# Patient Record
Sex: Male | Born: 1995 | Race: White | Hispanic: Yes | Marital: Single | Smoking: Never smoker
Health system: Southern US, Community
[De-identification: ages and names within clinical notes are randomized; demographics above are authoritative.]

## PROBLEM LIST (undated history)

## (undated) DIAGNOSIS — E7800 Pure hypercholesterolemia, unspecified: Secondary | ICD-10-CM

---
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
## 2020-02-22 ENCOUNTER — Other Ambulatory Visit: Payer: Self-pay

## 2020-02-22 ENCOUNTER — Encounter (HOSPITAL_COMMUNITY): Payer: Self-pay

## 2020-02-22 ENCOUNTER — Ambulatory Visit (HOSPITAL_COMMUNITY)
Admission: EM | Admit: 2020-02-22 | Discharge: 2020-02-22 | Disposition: A | Discharge status: Home / Self Care | Payer: Self-pay | Attending: Family Medicine | Admitting: Family Medicine

## 2020-02-22 DIAGNOSIS — L739 Follicular disorder, unspecified: Secondary | ICD-10-CM

## 2020-02-22 MED ORDER — DOXYCYCLINE HYCLATE 100 MG PO CAPS: 100.0000 mg | ORAL_CAPSULE | Freq: Two times a day (BID) | ORAL | 0 refills | Status: AC

## 2020-02-22 NOTE — ED Triage Notes (Signed)
Pt states rash and bumps on nape of neck x 3 weeks. Some have a slightly yellow center. Pt ao x4 and ambulatory.

## 2020-02-22 NOTE — Discharge Instructions (Signed)
Take the antibiotic as prescribed.  Keep the area clean with and wash daily with antibacterial soap. Follow up as needed for continued or worsening symptoms

## 2020-02-22 NOTE — ED Provider Notes (Signed)
[facility]    CSN: [id number] Arrival date & time: 02/22/20  1004      History   Chief Complaint Chief Complaint  Patient presents with  . Rash    Raised rash/bumps on nape of neck x3 weeks    HPI Daryl Kerr is a 24 y.o. male.   Patient is a 24 year old male presents today with rash.  Rash located to the hairline to posterior scalp area.  Red, raised bumps that are mildly irritating and itchy at times.  Some pustules.  This is been ongoing issue for the last 3 weeks.  No fever, chills, body aches     Past Medical History:  Diagnosis Date  . High cholesterol     There are no problems to display for this patient.   No past surgical history on file.     Home Medications    Prior to Admission medications   Medication Sig Start Date End Date Taking? Authorizing Provider  doxycycline (VIBRAMYCIN) 100 MG capsule Take 1 capsule (100 mg total) by mouth 2 (two) times daily. 02/22/20   Janace Aris, NP    Family History Family History  Problem Relation Age of Onset  . Diabetes Mother   . Healthy Father     Social History Social History   Tobacco Use  . Smoking status: Never Smoker  . Smokeless tobacco: Never Used  Vaping Use  . Vaping Use: Never used  Substance Use Topics  . Alcohol use: Yes    Comment: occasionallly  . Drug use: Never     Allergies   Patient has no known allergies.   Review of Systems Review of Systems   Physical Exam Triage Vital Signs ED Triage Vitals  Enc Vitals Group     BP 02/22/20 1033 (!) 125/91     Pulse Rate 02/22/20 1033 92     Resp 02/22/20 1033 18     Temp 02/22/20 1033 98.5 F (36.9 C)     Temp Source 02/22/20 1033 Oral     SpO2 02/22/20 1033 100 %     Weight --      Height --      Head Circumference --      Peak Flow --      Pain Score 02/22/20 1028 0     Pain Loc --      Pain Edu? --      Excl. in GC? --    No data found.  Updated Vital Signs BP (!) 125/91 (BP Location: Right  Arm)   Pulse 92   Temp 98.5 F (36.9 C) (Oral)   Resp 18   SpO2 100%   Visual Acuity Right Eye Distance:   Left Eye Distance:   Bilateral Distance:    Right Eye Near:   Left Eye Near:    Bilateral Near:     Physical Exam Vitals and nursing note reviewed.  Constitutional:      Appearance: Normal appearance.  HENT:     Head: Normocephalic and atraumatic.     Nose: Nose normal.  Eyes:     Conjunctiva/sclera: Conjunctivae normal.  Neck:      Comments: Raised bumps to entire posterior scalp at hairline.  Some pustules. Pulmonary:     Effort: Pulmonary effort is normal.  Musculoskeletal:        General: Normal range of motion.     Cervical back: Normal range of motion.  Skin:    General: Skin is warm and dry.  Findings: Rash present.  Neurological:     Mental Status: He is alert.  Psychiatric:        Mood and Affect: Mood normal.      UC Treatments / Results  Labs (all labs ordered are listed, but only abnormal results are displayed) Labs Reviewed - No data to display  EKG   Radiology No results found.  Procedures Procedures (including critical care time)  Medications Ordered in UC Medications - No data to display  Initial Impression / Assessment and Plan / UC Course  I have reviewed the triage vital signs and the nursing notes.  Pertinent labs & imaging results that were available during my care of the patient were reviewed by me and considered in my medical decision making (see chart for details).     Folliculitis Treating with doxycycline.  Recommended antibacterial soap. Follow up as needed for continued or worsening symptoms  Final Clinical Impressions(s) / UC Diagnoses   Final diagnoses:  Folliculitis     Discharge Instructions     Take the antibiotic as prescribed.  Keep the area clean with and wash daily with antibacterial soap. Follow up as needed for continued or worsening symptoms    ED Prescriptions    Medication Sig  Dispense Auth. Provider   doxycycline (VIBRAMYCIN) 100 MG capsule Take 1 capsule (100 mg total) by mouth 2 (two) times daily. 20 capsule Dahlia Byes A, NP     PDMP not reviewed this encounter.   Dahlia Byes A, NP 02/22/20 1048

## 2020-07-26 ENCOUNTER — Encounter (HOSPITAL_COMMUNITY): Payer: Self-pay

## 2020-07-26 ENCOUNTER — Ambulatory Visit (HOSPITAL_COMMUNITY)
Admission: EM | Admit: 2020-07-26 | Discharge: 2020-07-26 | Disposition: A | Discharge status: Home / Self Care | Payer: Self-pay | Attending: Emergency Medicine | Admitting: Emergency Medicine

## 2020-07-26 DIAGNOSIS — L739 Follicular disorder, unspecified: Secondary | ICD-10-CM

## 2020-07-26 MED ORDER — MUPIROCIN 2 % EX OINT: 1.0000 "application " | TOPICAL_OINTMENT | Freq: Two times a day (BID) | CUTANEOUS | 0 refills | Status: AC

## 2020-07-26 MED ORDER — MUPIROCIN CALCIUM 2 % EX CREA: 1.0000 "application " | TOPICAL_CREAM | Freq: Two times a day (BID) | CUTANEOUS | 0 refills | Status: DC

## 2020-07-26 NOTE — ED Provider Notes (Signed)
[facility]    CSN: [id number] Arrival date & time: 07/26/20  7510      History   Chief Complaint Chief Complaint  Patient presents with  . Rash    HPI Daryl Kerr is a 25 y.o. male.   Patient presents with bumps on the back of his neck intermittently x6 months.  The bumps occur in the area where his neck is shaved when he gets his hair cut.  He denies fever, chills, drainage from the area, or other symptoms.  No treatments attempted at home.  He was seen here for these symptoms in September 2021; diagnosed with folliculitis; treated with doxycycline.  He states the area improved with this medication but then returned.  The history is provided by the patient and medical records.    Past Medical History:  Diagnosis Date  . High cholesterol     There are no problems to display for this patient.   History reviewed. No pertinent surgical history.     Home Medications    Prior to Admission medications   Medication Sig Start Date End Date Taking? Authorizing Provider  mupirocin ointment (BACTROBAN) 2 % Apply 1 application topically 2 (two) times daily. 07/26/20  Yes Mickie Bail, NP  doxycycline (VIBRAMYCIN) 100 MG capsule Take 1 capsule (100 mg total) by mouth 2 (two) times daily. 02/22/20   Janace Aris, NP    Family History Family History  Problem Relation Age of Onset  . Diabetes Mother   . Healthy Father     Social History Social History   Tobacco Use  . Smoking status: Never Smoker  . Smokeless tobacco: Never Used  Vaping Use  . Vaping Use: Never used  Substance Use Topics  . Alcohol use: Yes    Comment: occasionallly  . Drug use: Never     Allergies   Patient has no known allergies.   Review of Systems Review of Systems  Constitutional: Negative for chills and fever.  HENT: Negative for ear pain and sore throat.   Eyes: Negative for pain and visual disturbance.  Respiratory: Negative for cough and shortness of breath.    Cardiovascular: Negative for chest pain and palpitations.  Gastrointestinal: Negative for abdominal pain and vomiting.  Genitourinary: Negative for dysuria and hematuria.  Musculoskeletal: Negative for arthralgias and back pain.  Skin: Positive for rash. Negative for color change.  Neurological: Negative for syncope, weakness and numbness.  All other systems reviewed and are negative.    Physical Exam Triage Vital Signs ED Triage Vitals  Enc Vitals Group     BP      Pulse      Resp      Temp      Temp src      SpO2      Weight      Height      Head Circumference      Peak Flow      Pain Score      Pain Loc      Pain Edu?      Excl. in GC?    No data found.  Updated Vital Signs BP 136/86 (BP Location: Right Arm)   Pulse 93   Temp 97.9 F (36.6 C) (Oral)   Resp 15   SpO2 96%   Visual Acuity Right Eye Distance:   Left Eye Distance:   Bilateral Distance:    Right Eye Near:   Left Eye Near:    Bilateral Near:  Physical Exam Vitals and nursing note reviewed.  Constitutional:      General: He is not in acute distress.    Appearance: He is well-developed and well-nourished. He is not ill-appearing.  HENT:     Head: Normocephalic and atraumatic.     Mouth/Throat:     Mouth: Mucous membranes are moist.  Eyes:     Conjunctiva/sclera: Conjunctivae normal.  Cardiovascular:     Rate and Rhythm: Normal rate and regular rhythm.     Heart sounds: Normal heart sounds.  Pulmonary:     Effort: Pulmonary effort is normal. No respiratory distress.     Breath sounds: Normal breath sounds.  Abdominal:     Palpations: Abdomen is soft.     Tenderness: There is no abdominal tenderness.  Musculoskeletal:        General: No edema.     Cervical back: Neck supple.  Skin:    General: Skin is warm and dry.     Findings: Rash present.     Comments: Flesh-colored papules on posterior neck at hairline.  No drainage or erythema.  Neurological:     General: No focal deficit  present.     Mental Status: He is alert and oriented to person, place, and time.     Gait: Gait normal.  Psychiatric:        Mood and Affect: Mood and affect and mood normal.        Behavior: Behavior normal.      UC Treatments / Results  Labs (all labs ordered are listed, but only abnormal results are displayed) Labs Reviewed - No data to display  EKG   Radiology No results found.  Procedures Procedures (including critical care time)  Medications Ordered in UC Medications - No data to display  Initial Impression / Assessment and Plan / UC Course  I have reviewed the triage vital signs and the nursing notes.  Pertinent labs & imaging results that were available during my care of the patient were reviewed by me and considered in my medical decision making (see chart for details).   Folliculitis on posterior neck.  Treating with Bactroban ointment.  Instructed patient to follow-up with his PCP or dermatologist if his symptoms are not improving.  He agrees to plan of care.   Final Clinical Impressions(s) / UC Diagnoses   Final diagnoses:  Folliculitis     Discharge Instructions     Use the Bactroban ointment as directed.    Follow up with your primary care provider or a dermatologist if your symptoms are not improving.         ED Prescriptions    Medication Sig Dispense Auth. Provider   mupirocin cream (BACTROBAN) 2 %  (Status: Discontinued) Apply 1 application topically 2 (two) times daily. 15 g Mickie Bail, NP   mupirocin ointment (BACTROBAN) 2 % Apply 1 application topically 2 (two) times daily. 22 g Mickie Bail, NP     PDMP not reviewed this encounter.   Mickie Bail, NP 07/26/20 [phone]

## 2020-07-26 NOTE — ED Triage Notes (Signed)
Pt c/o bumps on back of neck X 6 months. Pt states the bumps came back after he finished the antibiotics that were prescribed months ago.

## 2020-07-26 NOTE — Discharge Instructions (Signed)
Use the Bactroban ointment as directed.    Follow up with your primary care provider or a dermatologist if your symptoms are not improving.

## 2021-03-10 ENCOUNTER — Other Ambulatory Visit: Payer: Self-pay

## 2021-03-10 ENCOUNTER — Ambulatory Visit (HOSPITAL_COMMUNITY)
Admission: EM | Admit: 2021-03-10 | Discharge: 2021-03-10 | Disposition: A | Discharge status: Home / Self Care | Payer: Self-pay | Attending: Physician Assistant | Admitting: Physician Assistant

## 2021-03-10 ENCOUNTER — Encounter (HOSPITAL_COMMUNITY): Payer: Self-pay | Admitting: *Deleted

## 2021-03-10 DIAGNOSIS — L739 Follicular disorder, unspecified: Secondary | ICD-10-CM

## 2021-03-10 MED ORDER — MUPIROCIN CALCIUM 2 % NA OINT: TOPICAL_OINTMENT | NASAL | 0 refills | Status: AC

## 2021-03-10 MED ORDER — MUPIROCIN CALCIUM 2 % EX CREA: 1.0000 "application " | TOPICAL_CREAM | Freq: Two times a day (BID) | CUTANEOUS | 0 refills | Status: AC

## 2021-03-10 NOTE — ED Provider Notes (Signed)
[facility]    CSN: [id number] Arrival date & time: 03/10/21  6440      History   Chief Complaint Chief Complaint  Patient presents with   Medication Refill    HPI Daryl Kerr is a 25 y.o. male.   Patient here today for refill of mupirocin ointment that he was previously prescribed for folliculitis.  He reports that he did have significant improvement with treatment, but folliculitis has not cleared completely.  He has not had any fever or chills.  Denies any nausea or vomiting, and has no other complaints.  The history is provided by the patient.  Medication Refill  Past Medical History:  Diagnosis Date   High cholesterol     There are no problems to display for this patient.   History reviewed. No pertinent surgical history.     Home Medications    Prior to Admission medications   Medication Sig Start Date End Date Taking? Authorizing Provider  mupirocin cream (BACTROBAN) 2 % Apply 1 application topically 2 (two) times daily. 03/10/21  Yes Tomi Bamberger, PA-C  mupirocin nasal ointment (BACTROBAN) 2 % Apply in each nostril daily 03/10/21  Yes Tomi Bamberger, PA-C  doxycycline (VIBRAMYCIN) 100 MG capsule Take 1 capsule (100 mg total) by mouth 2 (two) times daily. 02/22/20   Dahlia Byes A, NP  mupirocin ointment (BACTROBAN) 2 % Apply 1 application topically 2 (two) times daily. 07/26/20   Mickie Bail, NP    Family History Family History  Problem Relation Age of Onset   Diabetes Mother    Healthy Father     Social History Social History   Tobacco Use   Smoking status: Never   Smokeless tobacco: Never  Vaping Use   Vaping Use: Never used  Substance Use Topics   Alcohol use: Yes    Comment: occasionallly   Drug use: Never     Allergies   Patient has no known allergies.   Review of Systems Review of Systems  Constitutional:  Negative for chills and fever.  Respiratory:  Negative for shortness of breath.   Gastrointestinal:   Negative for nausea and vomiting.  Skin:  Positive for rash.    Physical Exam Triage Vital Signs ED Triage Vitals  Enc Vitals Group     BP 03/10/21 1022 94/75     Pulse Rate 03/10/21 1022 89     Resp 03/10/21 1022 20     Temp 03/10/21 1022 98 F (36.7 C)     Temp src --      SpO2 03/10/21 1022 100 %     Weight --      Height --      Head Circumference --      Peak Flow --      Pain Score 03/10/21 1017 0     Pain Loc --      Pain Edu? --      Excl. in GC? --    No data found.  Updated Vital Signs BP 94/75   Pulse 89   Temp 98 F (36.7 C)   Resp 20   SpO2 100%      Physical Exam Vitals and nursing note reviewed.  Constitutional:      General: He is not in acute distress.    Appearance: Normal appearance. He is not ill-appearing.  HENT:     Head: Normocephalic and atraumatic.  Eyes:     Conjunctiva/sclera: Conjunctivae normal.  Cardiovascular:  Rate and Rhythm: Normal rate.  Pulmonary:     Effort: Pulmonary effort is normal.  Skin:    General: Skin is warm and dry.     Findings: No rash.     Comments: Scattered pustular appearing lesions to base of scalp  Neurological:     Mental Status: He is alert.  Psychiatric:        Mood and Affect: Mood normal.        Thought Content: Thought content normal.     UC Treatments / Results  Labs (all labs ordered are listed, but only abnormal results are displayed) Labs Reviewed - No data to display  EKG   Radiology No results found.  Procedures Procedures (including critical care time)  Medications Ordered in UC Medications - No data to display  Initial Impression / Assessment and Plan / UC Course  I have reviewed the triage vital signs and the nursing notes.  Pertinent labs & imaging results that were available during my care of the patient were reviewed by me and considered in my medical decision making (see chart for details).  Rash consistent with folliculitis, well refill mupirocin as  requested.  Encouraged follow-up if symptoms do not continue to improve or worsen in any way.  Final Clinical Impressions(s) / UC Diagnoses   Final diagnoses:  Folliculitis     Discharge Instructions      Apply topical treatment daily. Follow up with any further concerns.      ED Prescriptions     Medication Sig Dispense Auth. Provider   mupirocin nasal ointment (BACTROBAN) 2 % Apply in each nostril daily 1 g Tomi Bamberger, PA-C   mupirocin cream (BACTROBAN) 2 % Apply 1 application topically 2 (two) times daily. 15 g Tomi Bamberger, PA-C      PDMP not reviewed this encounter.   Tomi Bamberger, PA-C 03/10/21 1048

## 2021-03-10 NOTE — ED Triage Notes (Signed)
Pt needs med refill . Pt reports the med is a cream . He finished last refill and still has bumps.

## 2021-03-10 NOTE — Discharge Instructions (Addendum)
Apply topical treatment daily. Follow up with any further concerns.
